# Patient Record
Sex: Male | Born: 1975 | Race: White | Hispanic: No | Marital: Single | Smoking: Current every day smoker
Health system: Southern US, Community
[De-identification: ages and names within clinical notes are randomized; demographics above are authoritative.]

---

## 2000-04-30 ENCOUNTER — Emergency Department (HOSPITAL_COMMUNITY): Admission: EM | Admit: 2000-04-30 | Discharge: 2000-05-01 | Payer: Self-pay | Admitting: Emergency Medicine

## 2000-04-30 ENCOUNTER — Encounter: Payer: Self-pay | Admitting: Emergency Medicine

## 2003-11-13 ENCOUNTER — Encounter: Admission: RE | Admit: 2003-11-13 | Discharge: 2003-11-13 | Payer: Self-pay | Admitting: Infectious Diseases

## 2003-11-17 ENCOUNTER — Ambulatory Visit (HOSPITAL_COMMUNITY): Admission: RE | Admit: 2003-11-17 | Discharge: 2003-11-17 | Payer: Self-pay | Admitting: Infectious Diseases

## 2003-11-21 ENCOUNTER — Encounter: Admission: RE | Admit: 2003-11-21 | Discharge: 2003-11-21 | Payer: Self-pay | Admitting: Infectious Diseases

## 2003-11-21 ENCOUNTER — Ambulatory Visit (HOSPITAL_COMMUNITY): Admission: RE | Admit: 2003-11-21 | Discharge: 2003-11-21 | Payer: Self-pay | Admitting: Infectious Diseases

## 2003-12-12 ENCOUNTER — Encounter: Admission: RE | Admit: 2003-12-12 | Discharge: 2003-12-12 | Payer: Self-pay | Admitting: Infectious Diseases

## 2004-01-08 ENCOUNTER — Encounter: Admission: RE | Admit: 2004-01-08 | Discharge: 2004-01-08 | Payer: Self-pay | Admitting: Infectious Diseases

## 2005-02-10 ENCOUNTER — Ambulatory Visit: Payer: Self-pay | Admitting: Infectious Diseases

## 2005-02-11 ENCOUNTER — Ambulatory Visit (HOSPITAL_COMMUNITY): Admission: RE | Admit: 2005-02-11 | Discharge: 2005-02-11 | Payer: Self-pay | Admitting: Infectious Diseases

## 2005-02-17 ENCOUNTER — Ambulatory Visit: Payer: Self-pay | Admitting: Infectious Diseases

## 2005-04-16 ENCOUNTER — Emergency Department (HOSPITAL_COMMUNITY): Admission: EM | Admit: 2005-04-16 | Discharge: 2005-04-16 | Payer: Self-pay | Admitting: Emergency Medicine

## 2005-04-18 ENCOUNTER — Encounter
Admission: RE | Admit: 2005-04-18 | Discharge: 2005-07-17 | Payer: Self-pay | Admitting: Physical Medicine & Rehabilitation

## 2005-04-18 ENCOUNTER — Ambulatory Visit: Payer: Self-pay | Admitting: Anesthesiology

## 2005-12-21 ENCOUNTER — Emergency Department (HOSPITAL_COMMUNITY): Admission: EM | Admit: 2005-12-21 | Discharge: 2005-12-21 | Payer: Self-pay | Admitting: Emergency Medicine

## 2007-03-02 ENCOUNTER — Emergency Department (HOSPITAL_COMMUNITY): Admission: EM | Admit: 2007-03-02 | Discharge: 2007-03-02 | Payer: Self-pay | Admitting: Emergency Medicine

## 2007-05-14 ENCOUNTER — Emergency Department (HOSPITAL_COMMUNITY): Admission: EM | Admit: 2007-05-14 | Discharge: 2007-05-14 | Payer: Self-pay | Admitting: Emergency Medicine

## 2011-02-09 ENCOUNTER — Emergency Department: Payer: Self-pay | Admitting: Unknown Physician Specialty
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
## 2011-04-09 ENCOUNTER — Emergency Department (HOSPITAL_COMMUNITY): Payer: Self-pay

## 2011-04-09 ENCOUNTER — Emergency Department (HOSPITAL_COMMUNITY)
Admission: EM | Admit: 2011-04-09 | Discharge: 2011-04-09 | Disposition: A | Discharge status: Home / Self Care | Payer: Self-pay | Attending: Emergency Medicine | Admitting: Emergency Medicine

## 2011-04-09 DIAGNOSIS — S0083XA Contusion of other part of head, initial encounter: Secondary | ICD-10-CM | POA: Insufficient documentation

## 2011-04-09 DIAGNOSIS — IMO0002 Reserved for concepts with insufficient information to code with codable children: Secondary | ICD-10-CM | POA: Insufficient documentation

## 2011-04-09 DIAGNOSIS — Y92410 Unspecified street and highway as the place of occurrence of the external cause: Secondary | ICD-10-CM | POA: Insufficient documentation

## 2011-04-09 DIAGNOSIS — S0003XA Contusion of scalp, initial encounter: Secondary | ICD-10-CM | POA: Insufficient documentation

## 2011-04-09 DIAGNOSIS — Y998 Other external cause status: Secondary | ICD-10-CM | POA: Insufficient documentation

## 2013-11-09 ENCOUNTER — Emergency Department (HOSPITAL_COMMUNITY): Payer: Medicaid Other

## 2013-11-09 ENCOUNTER — Emergency Department (HOSPITAL_COMMUNITY)
Admission: EM | Admit: 2013-11-09 | Discharge: 2013-11-09 | Disposition: A | Discharge status: Home / Self Care | Payer: Medicaid Other | Attending: Emergency Medicine | Admitting: Emergency Medicine

## 2013-11-09 ENCOUNTER — Encounter (HOSPITAL_COMMUNITY): Payer: Self-pay | Admitting: Emergency Medicine

## 2013-11-09 DIAGNOSIS — R1110 Vomiting, unspecified: Secondary | ICD-10-CM

## 2013-11-09 DIAGNOSIS — R197 Diarrhea, unspecified: Secondary | ICD-10-CM | POA: Insufficient documentation

## 2013-11-09 DIAGNOSIS — R109 Unspecified abdominal pain: Secondary | ICD-10-CM | POA: Insufficient documentation

## 2013-11-09 DIAGNOSIS — R112 Nausea with vomiting, unspecified: Secondary | ICD-10-CM | POA: Insufficient documentation

## 2013-11-09 DIAGNOSIS — F17200 Nicotine dependence, unspecified, uncomplicated: Secondary | ICD-10-CM | POA: Insufficient documentation

## 2013-11-09 DIAGNOSIS — R630 Anorexia: Secondary | ICD-10-CM | POA: Insufficient documentation

## 2013-11-09 DIAGNOSIS — R1030 Lower abdominal pain, unspecified: Secondary | ICD-10-CM

## 2013-11-09 LAB — CBC WITH DIFFERENTIAL/PLATELET
BASOS PCT: 0 % (ref 0–1)
Basophils Absolute: 0 10*3/uL (ref 0.0–0.1)
EOS PCT: 0 % (ref 0–5)
Eosinophils Absolute: 0 10*3/uL (ref 0.0–0.7)
HCT: 41 % (ref 39.0–52.0)
HEMOGLOBIN: 14.3 g/dL (ref 13.0–17.0)
LYMPHS ABS: 1.6 10*3/uL (ref 0.7–4.0)
LYMPHS PCT: 16 % (ref 12–46)
MCH: 30 pg (ref 26.0–34.0)
MCHC: 34.9 g/dL (ref 30.0–36.0)
MCV: 86.1 fL (ref 78.0–100.0)
MONO ABS: 0.9 10*3/uL (ref 0.1–1.0)
MONOS PCT: 9 % (ref 3–12)
Neutro Abs: 7.5 10*3/uL (ref 1.7–7.7)
Neutrophils Relative %: 74 % (ref 43–77)
Platelets: 274 10*3/uL (ref 150–400)
RBC: 4.76 MIL/uL (ref 4.22–5.81)
RDW: 12.8 % (ref 11.5–15.5)
WBC: 10.1 10*3/uL (ref 4.0–10.5)

## 2013-11-09 LAB — COMPREHENSIVE METABOLIC PANEL
ALBUMIN: 4.1 g/dL (ref 3.5–5.2)
ALT: 25 U/L (ref 0–53)
AST: 18 U/L (ref 0–37)
Alkaline Phosphatase: 84 U/L (ref 39–117)
BILIRUBIN TOTAL: 0.6 mg/dL (ref 0.3–1.2)
BUN: 14 mg/dL (ref 6–23)
CALCIUM: 9 mg/dL (ref 8.4–10.5)
CHLORIDE: 102 meq/L (ref 96–112)
CO2: 26 mEq/L (ref 19–32)
Creatinine, Ser: 0.95 mg/dL (ref 0.50–1.35)
Glucose, Bld: 101 mg/dL — ABNORMAL HIGH (ref 70–99)
Potassium: 3.9 mEq/L (ref 3.7–5.3)
SODIUM: 139 meq/L (ref 137–147)
TOTAL PROTEIN: 6.7 g/dL (ref 6.0–8.3)

## 2013-11-09 LAB — LIPASE, BLOOD: LIPASE: 25 U/L (ref 11–59)

## 2013-11-09 MED ORDER — IOHEXOL 300 MG/ML  SOLN
100.0000 mL | Freq: Once | INTRAMUSCULAR | Status: AC | PRN
  Administered 2013-11-09: 100 mL via INTRAVENOUS

## 2013-11-09 MED ORDER — SODIUM CHLORIDE 0.9 % IV BOLUS (SEPSIS)
1000.0000 mL | Freq: Once | INTRAVENOUS | Status: AC
  Administered 2013-11-09: 1000 mL via INTRAVENOUS

## 2013-11-09 MED ORDER — ONDANSETRON HCL 4 MG PO TABS: 4.0000 mg | ORAL_TABLET | Freq: Four times a day (QID) | ORAL | Status: AC

## 2013-11-09 MED ORDER — GI COCKTAIL ~~LOC~~
30.0000 mL | Freq: Once | ORAL | Status: AC
  Administered 2013-11-09: 30 mL via ORAL
  Filled 2013-11-09: qty 30

## 2013-11-09 MED ORDER — ONDANSETRON HCL 4 MG/2ML IJ SOLN
4.0000 mg | Freq: Once | INTRAMUSCULAR | Status: AC
  Administered 2013-11-09: 4 mg via INTRAVENOUS
  Filled 2013-11-09: qty 2

## 2013-11-09 MED ORDER — HYDROCODONE-ACETAMINOPHEN 5-325 MG PO TABS: 1.0000 | ORAL_TABLET | Freq: Four times a day (QID) | ORAL | Status: AC | PRN

## 2013-11-09 MED ORDER — IOHEXOL 300 MG/ML  SOLN
50.0000 mL | Freq: Once | INTRAMUSCULAR | Status: AC | PRN
  Administered 2013-11-09: 50 mL via ORAL

## 2013-11-09 NOTE — ED Notes (Signed)
Pt transported to CT ?

## 2013-11-09 NOTE — ED Notes (Signed)
Bed: WA04 Expected date:  Expected time:  Means of arrival:  Comments: EMS- abdominal pain/n/v

## 2013-11-09 NOTE — Discharge Instructions (Signed)
Abdominal Pain, Adult °Many things can cause abdominal pain. Usually, abdominal pain is not caused by a disease and will improve without treatment. It can often be observed and treated at home. Your health care provider will do a physical exam and possibly order blood tests and X-rays to help determine the seriousness of your pain. However, in many cases, more time must pass before a clear cause of the pain can be found. Before that point, your health care provider may not know if you need more testing or further treatment. °HOME CARE INSTRUCTIONS  °Monitor your abdominal pain for any changes. The following actions may help to alleviate any discomfort you are experiencing: °· Only take over-the-counter or prescription medicines as directed by your health care provider. °· Do not take laxatives unless directed to do so by your health care provider. °· Try a clear liquid diet (broth, tea, or water) as directed by your health care provider. Slowly move to a bland diet as tolerated. °SEEK MEDICAL CARE IF: °· You have unexplained abdominal pain. °· You have abdominal pain associated with nausea or diarrhea. °· You have pain when you urinate or have a bowel movement. °· You experience abdominal pain that wakes you in the night. °· You have abdominal pain that is worsened or improved by eating food. °· You have abdominal pain that is worsened with eating fatty foods. °SEEK IMMEDIATE MEDICAL CARE IF:  °· Your pain does not go away within 2 hours. °· You have a fever. °· You keep throwing up (vomiting). °· Your pain is felt only in portions of the abdomen, such as the right side or the left lower portion of the abdomen. °· You pass bloody or black tarry stools. °MAKE SURE YOU: °· Understand these instructions.   °· Will watch your condition.   °· Will get help right away if you are not doing well or get worse.   °Document Released: 07/27/2005 Document Revised: 08/07/2013 Document Reviewed: 06/26/2013 °[employer]® Patient  Information ©2014 [employer]. ° °

## 2013-11-09 NOTE — ED Notes (Signed)
Pt noted to have vomited once in the floor. All clear liquid. Pt's gown changed. Pt sts he "has never vomited just all clear fluid before."

## 2013-11-09 NOTE — ED Notes (Signed)
Pt given an urinal

## 2013-11-09 NOTE — ED Notes (Signed)
Pt, from home, c/o intermittent abdominal pain, emesis, and diarrhea x 6 months.  Pain score 8/10.  Tender on palpation.  Vitals are stable.

## 2013-11-09 NOTE — ED Provider Notes (Signed)
CSN: [id number]     Arrival date & time 11/09/13  4098 History   First MD Initiated Contact with Patient 11/09/13 [phone]     Chief Complaint  Patient presents with  . Abdominal Pain  . Emesis  . Diarrhea   (Consider location/radiation/quality/duration/timing/severity/associated sxs/prior Treatment) Patient is a 38 y.o. male presenting with abdominal pain, vomiting, and diarrhea. The history is provided by the patient and a relative.  Abdominal Pain Pain location:  Periumbilical, LLQ and RLQ Pain quality: aching   Pain radiates to:  Does not radiate Pain severity:  Severe Onset quality:  Sudden Duration: 6 months. Timing:  Intermittent Progression:  Waxing and waning Chronicity:  Chronic Context: retching   Context: not diet changes, not eating, not medication withdrawal, not previous surgeries, not recent illness and not recent travel   Relieved by:  Nothing Worsened by:  Nothing tried Ineffective treatments:  None tried Associated symptoms: anorexia, constipation, diarrhea (had d/a for first 4 months, none since), nausea and vomiting   Associated symptoms: no chest pain, no cough, no dysuria, no fatigue, no fever and no shortness of breath   Emesis Associated symptoms: abdominal pain and diarrhea (had d/a for first 4 months, none since)   Associated symptoms: no headaches   Diarrhea Associated symptoms: abdominal pain and vomiting   Associated symptoms: no fever and no headaches     History reviewed. No pertinent past medical history. History reviewed. No pertinent past surgical history. No family history on file. History  Substance Use Topics  . Smoking status: Current Every Day Smoker  . Smokeless tobacco: Not on file  . Alcohol Use: Yes     Comment: seldom    Review of Systems  Constitutional: Negative for fever, activity change, appetite change and fatigue.  HENT: Negative for congestion, facial swelling, rhinorrhea and trouble swallowing.   Eyes: Negative for  photophobia and pain.  Respiratory: Negative for cough, chest tightness and shortness of breath.   Cardiovascular: Negative for chest pain and leg swelling.  Gastrointestinal: Positive for nausea, vomiting, abdominal pain, diarrhea (had d/a for first 4 months, none since), constipation and anorexia.  Endocrine: Negative for polydipsia and polyuria.  Genitourinary: Negative for dysuria, urgency, decreased urine volume and difficulty urinating.  Musculoskeletal: Negative for back pain and gait problem.  Skin: Negative for color change, rash and wound.  Allergic/Immunologic: Negative for immunocompromised state.  Neurological: Negative for dizziness, facial asymmetry, speech difficulty, weakness, numbness and headaches.  Psychiatric/Behavioral: Negative for confusion, decreased concentration and agitation.    Allergies  Review of patient's allergies indicates no known allergies.  Home Medications   Current Outpatient Rx  Name  Route  Sig  Dispense  Refill  . Milk Thistle 1000 MG CAPS   Oral   Take 1 capsule by mouth daily.         Marland Kitchen oxyCODONE-acetaminophen (PERCOCET) 10-325 MG per tablet   Oral   Take 1 tablet by mouth 2 (two) times daily.         Marland Kitchen HYDROcodone-acetaminophen (NORCO) 5-325 MG per tablet   Oral   Take 1 tablet by mouth every 6 (six) hours as needed.   10 tablet   0   . ondansetron (ZOFRAN) 4 MG tablet   Oral   Take 1 tablet (4 mg total) by mouth every 6 (six) hours.   12 tablet   0    BP 118/69  Pulse 88  Temp(Src) 97.7 F (36.5 C) (Oral)  Resp 16  SpO2 97% Physical  Exam  Constitutional: He is oriented to person, place, and time. He appears well-developed and well-nourished. No distress.  HENT:  Head: Normocephalic and atraumatic.  Mouth/Throat: No oropharyngeal exudate.  Eyes: Pupils are equal, round, and reactive to light.  Neck: Normal range of motion. Neck supple.  Cardiovascular: Normal rate, regular rhythm and normal heart sounds.  Exam  reveals no gallop and no friction rub.   No murmur heard. Pulmonary/Chest: Effort normal and breath sounds normal. No respiratory distress. He has no wheezes. He has no rales.  Abdominal: Soft. Bowel sounds are normal. He exhibits no distension and no mass. There is tenderness (generalized without rebound or guarding). There is no rebound and no guarding.  Musculoskeletal: Normal range of motion. He exhibits no edema and no tenderness.  Neurological: He is alert and oriented to person, place, and time.  Skin: Skin is warm and dry.  Psychiatric: He has a normal mood and affect.    ED Course  Procedures (including critical care time) Labs Review Labs Reviewed  COMPREHENSIVE METABOLIC PANEL - Abnormal; Notable for the following:    Glucose, Bld 101 (*)    All other components within normal limits  CLOSTRIDIUM DIFFICILE BY PCR  CBC WITH DIFFERENTIAL  LIPASE, BLOOD   Imaging Review Ct Abdomen Pelvis W Contrast  11/09/2013   CLINICAL DATA:  Pain and diarrhea.  EXAM: CT ABDOMEN AND PELVIS WITH CONTRAST  TECHNIQUE: Multidetector CT imaging of the abdomen and pelvis was performed using the standard protocol following bolus administration of intravenous contrast.  CONTRAST:  OMNIPAQUE IOHEXOL 300 MG/ML  SOLN  COMPARISON:  None.  FINDINGS: The lung bases are clear except for dependent bibasilar atelectasis/ edema. The heart is normal in size. No pericardial effusion.  The liver is unremarkable. The gallbladder is normal. No common bowel duct dilatation. The pancreas is normal. The spleen is normal. The adrenal glands and kidneys are normal.  The stomach is unremarkable. The duodenum and small bowel appear normal. No inflammatory changes, mass lesions or obstructive findings. There is mild fibro fatty infiltrated type changes involving the distal ileal loops of small bowel and terminal ileum. This can be seen with chronic inflammatory bowel disease. No active/acute inflammation. The appendix is  normal.  No mesenteric or retroperitoneal mass or adenopathy. The aorta and branch vessels are normal. The portal, hepatic, splenic and renal veins are patent.  The bladder, prostate gland and seminal vesicles are unremarkable except for prostatic utricular cyst which measures 2.3 x 2.0 cm. No pelvic mass, adenopathy or free pelvic fluid collections. No inguinal mass or adenopathy. There is a small right inguinal hernia containing fat.  The bony structures are unremarkable.  IMPRESSION: 1. Fibrofatty submucosal infiltrative type changes involving the distal and terminal ileum. This is most likely due to chronic inflammatory bowel disease. No active/acute inflammation is identified. 2. 2 centimeter prostatic utricular cyst. 3. No acute abdominal/pelvic findings, mass lesions or adenopathy.   Electronically Signed   By: Loralie Champagne M.D.   On: 11/09/2013 13:05    EKG Interpretation   None       MDM   1. Lower abdominal pain   2. Vomiting and diarrhea    Pt is a 38 y.o. male with Pmhx as above who presents with 6 months of periumbilical and low abdominal pain about 5 days a week lasting mins to hours w/o aggrevating or alleviating symptoms, with assoc occasional n/v, with worsening n/v, lightheadedness & near syncopal episodes last PM. Pt states be  has no pain currently, only soreness, but still feels weak.  He was evaluated 6 months ago for this problem, but has not seen a doctor since then.  On PE, VSS, pt in NAD.  +generalized abdominal tenderness. W/u shows unremarkable CBC, CMP, lipase.  CT ab/pelvis show evidence of likely IBD, but no acute inflammation.  Will refer to GI as outpt.  Will not start steroids as there does not appear to be acute flare.  Return precautions given for new or worsening symptoms including worsening pain, fever, inability to tolerate PO.          Shanna CiscoMegan E Jilene Spohr, MD 11/09/13 [phone]

## 2013-11-22 ENCOUNTER — Other Ambulatory Visit: Payer: Self-pay | Admitting: Gastroenterology

## 2013-11-22 DIAGNOSIS — R109 Unspecified abdominal pain: Secondary | ICD-10-CM

## 2013-11-26 ENCOUNTER — Ambulatory Visit
Admission: RE | Admit: 2013-11-26 | Discharge: 2013-11-26 | Disposition: A | Discharge status: Home / Self Care | Payer: No Typology Code available for payment source | Source: Ambulatory Visit | Attending: Gastroenterology | Admitting: Gastroenterology

## 2013-11-26 DIAGNOSIS — R109 Unspecified abdominal pain: Secondary | ICD-10-CM

## 2013-11-27 ENCOUNTER — Other Ambulatory Visit (HOSPITAL_COMMUNITY): Payer: Self-pay | Admitting: Gastroenterology

## 2013-11-27 DIAGNOSIS — R9389 Abnormal findings on diagnostic imaging of other specified body structures: Secondary | ICD-10-CM

## 2013-11-27 DIAGNOSIS — R109 Unspecified abdominal pain: Secondary | ICD-10-CM

## 2013-11-29 ENCOUNTER — Ambulatory Visit (HOSPITAL_COMMUNITY)
Admission: RE | Admit: 2013-11-29 | Discharge: 2013-11-29 | Disposition: A | Discharge status: Home / Self Care | Payer: Medicaid Other | Source: Ambulatory Visit | Attending: Gastroenterology | Admitting: Gastroenterology

## 2013-11-29 DIAGNOSIS — R1013 Epigastric pain: Secondary | ICD-10-CM | POA: Insufficient documentation

## 2013-11-29 DIAGNOSIS — R9389 Abnormal findings on diagnostic imaging of other specified body structures: Secondary | ICD-10-CM

## 2013-11-29 DIAGNOSIS — R109 Unspecified abdominal pain: Secondary | ICD-10-CM

## 2014-07-08 IMAGING — CR DG BE W/ AIR HIGH DENSITY
8 of 10 series · 8 of 10 positions shown · non-contrast
Comparison: DG UGI W/SMALL BOWEL HIGH DENSITY dated 11/26/2013; CT
ABD/PELVIS W CM dated 11/09/2013

FLUOROSCOPY TIME:  6 min, 13 seconds

CLINICAL DATA: Epigastric pain. Fat deposition in the distal ileum
wall, which can be incidental but can be observed in the setting of
inflammatory bowel disease.

EXAM:
AIR CONTRAST BARIUM ENEMA
TECHNIQUE: Initial scout AP supine abdominal image obtained to insure adequate
colon cleansing. Barium was introduced into the colon in a
retrograde fashion and refluxed from the rectum to the distal
transverse colon. As much of the barium as possible was then removed
through the indwelling tube via gravity drain. Air was then
insufflated into the colon. Spot images of the colon followed by
overhead radiographs were obtained.

[view not recorded (1 of 8)]
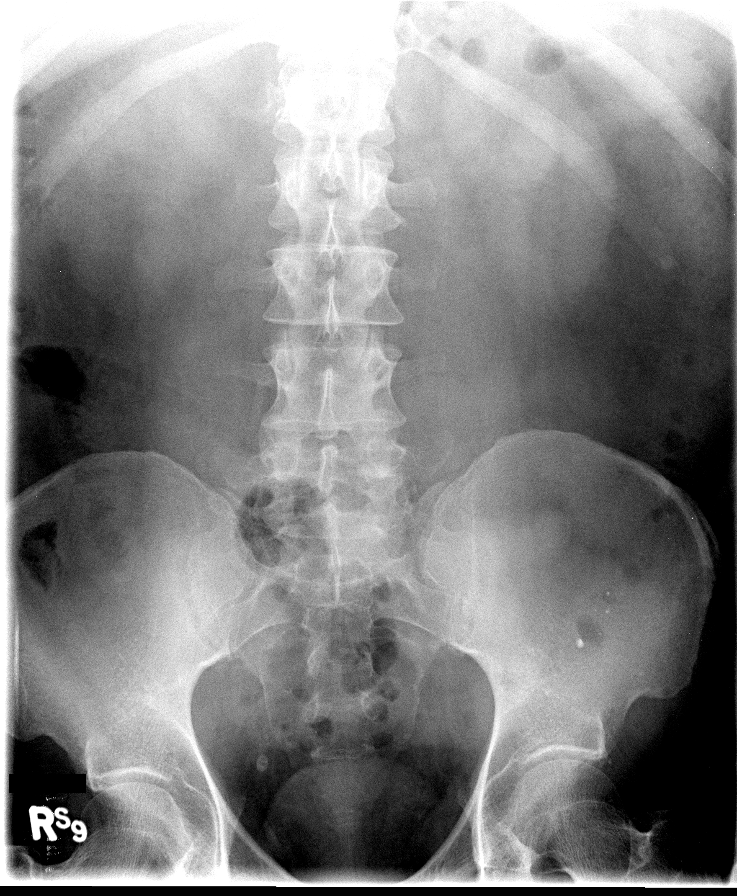

[view not recorded (2 of 8)]
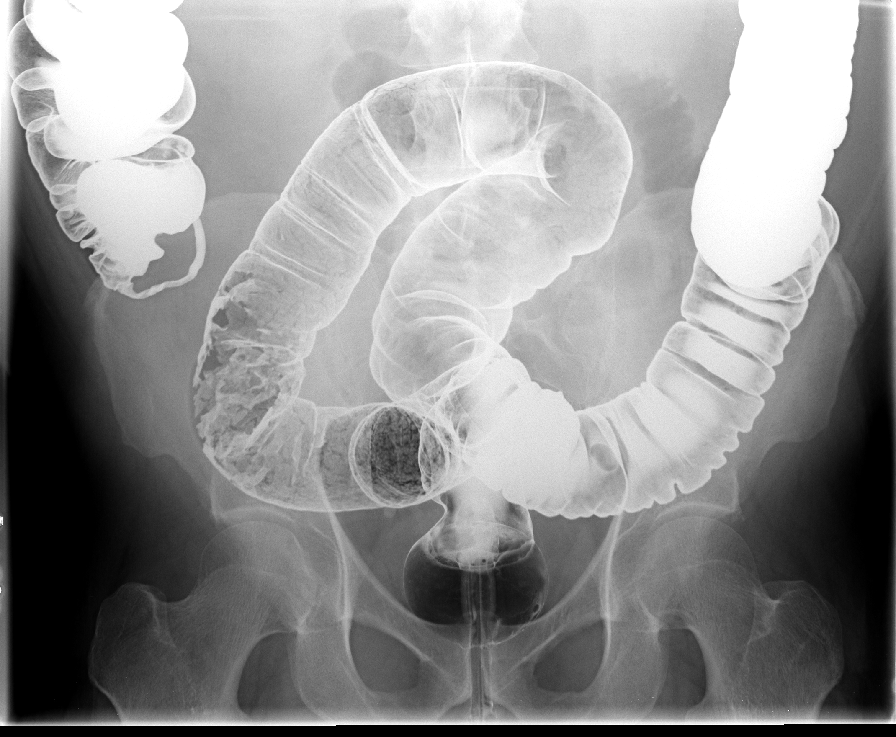

[view not recorded (3 of 8)]
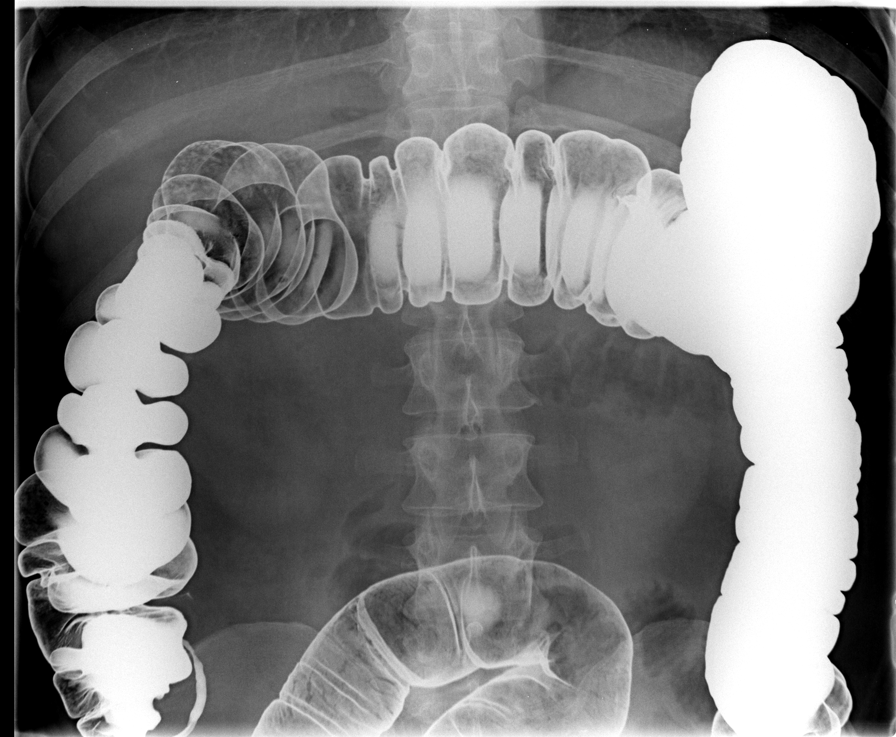

[view not recorded (4 of 8)]
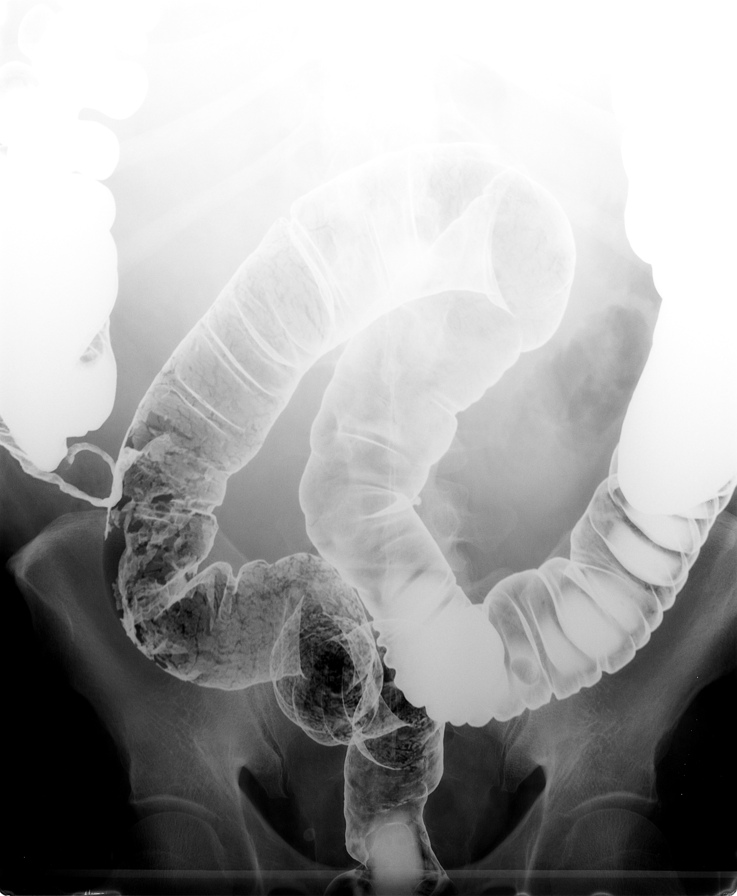

[view not recorded (5 of 8)]
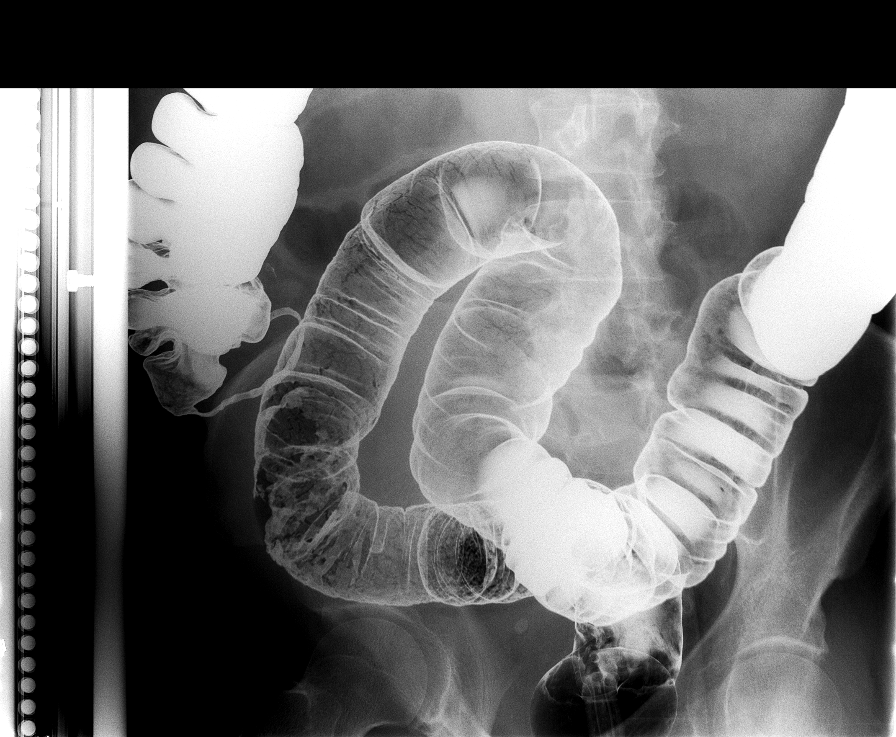

[view not recorded (6 of 8)]
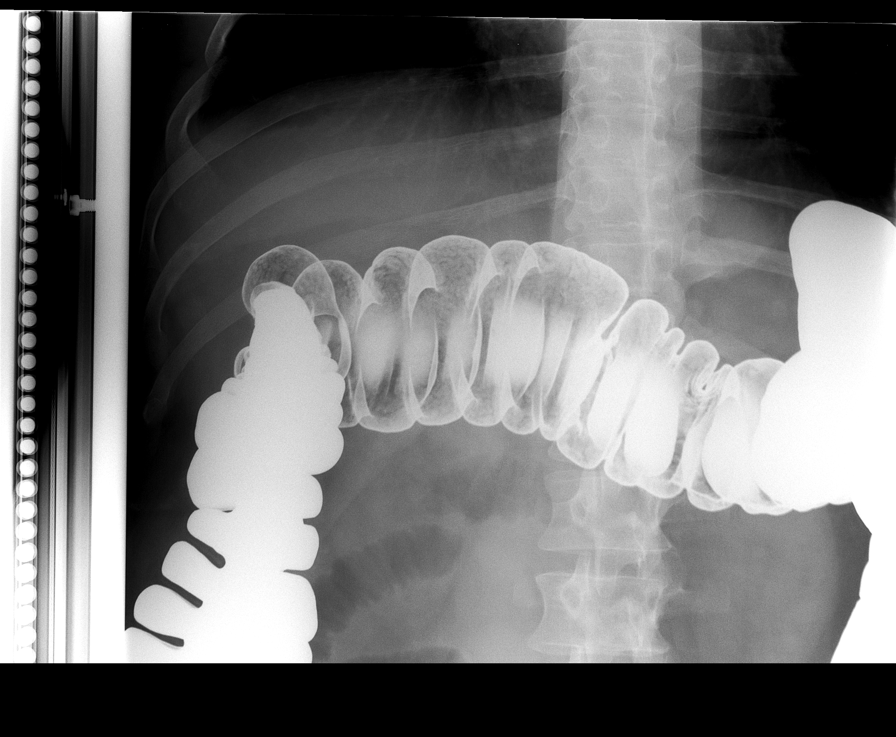

[view not recorded (7 of 8)]
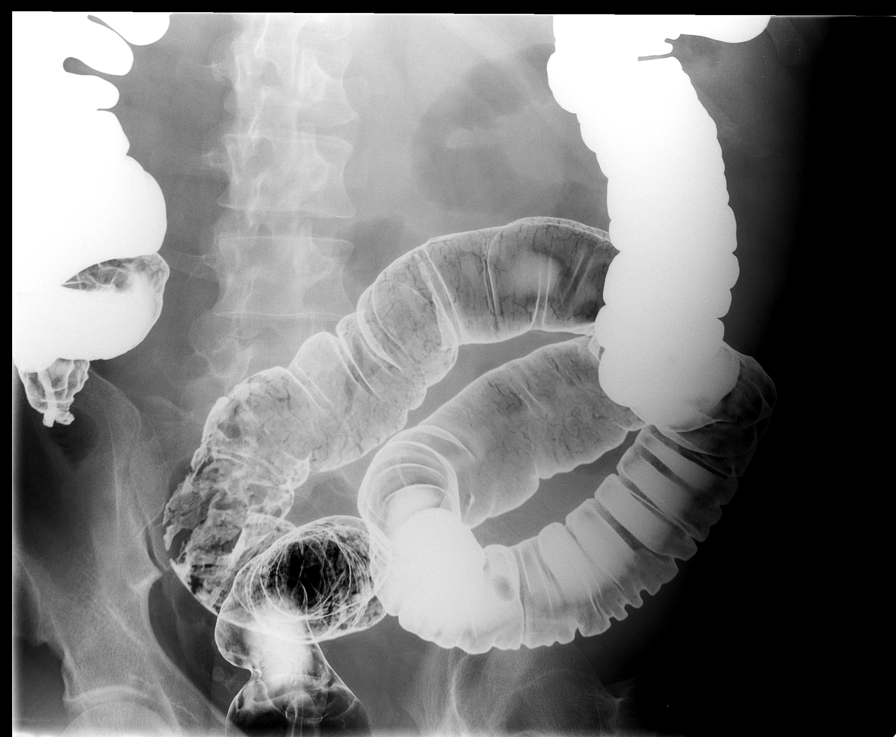

[view not recorded (8 of 8)]
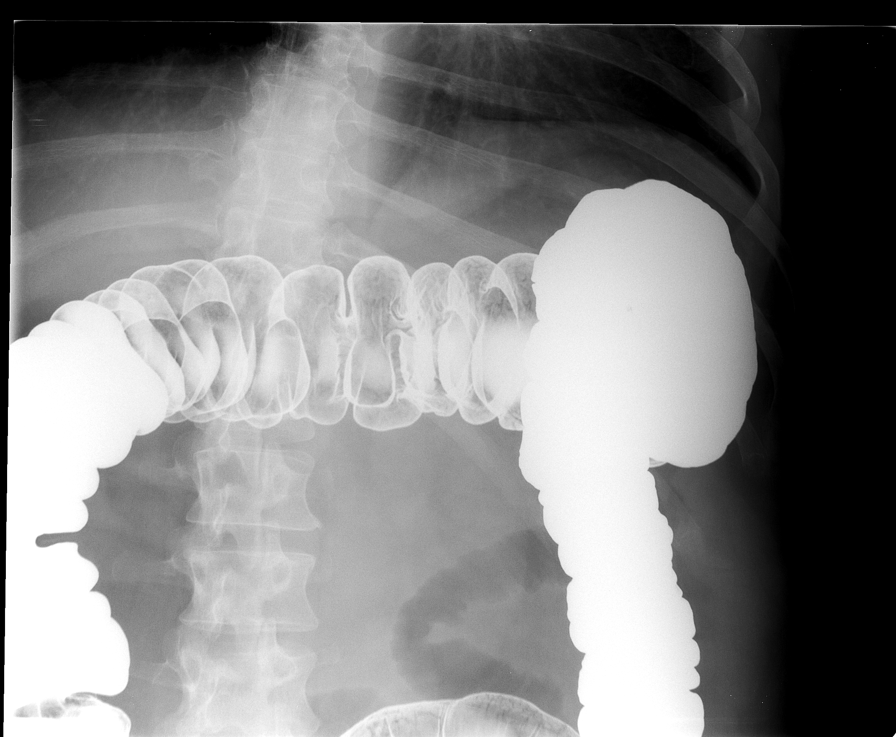

[8 of 10 positions shown; findings below may reference images not displayed]

FINDINGS: Initial KUB demonstrates a tiny amount of residual contrast medium
in the descending colon, but not enough to interfere with the exam.

A somewhat tortuous course of the sigmoid colon made it difficult a
drain the barium from the colon for air contrast. I performed a
fairly exhaustive set of maneuvers including upright, Trendelenburg,
right and left decubitus, and other gravity maneuvers to drain as
much contrast as I could from the colon, but despite this there is
still considerable residual barium pooling in parts of the colon
which can adversely affect diagnostic sensitivity.

12 mm persistent filling defect in the sigmoid colon as on series 19
is suspicious for a small polyp.

There is a 10 mm slightly more irregular filling defect in the
proximal descending colon as on series 26, potentially from adherent
stool or small polyp.

We were able to reflux the appendix. The terminal ileum did not
reflux. No other significant abnormalities observed.
IMPRESSION: 1. There is suspicion for a 12 mm sigmoid colon polyp and a 10 mm
proximal descending colon polyp. Otherwise normal exam.

## 2019-06-01 DEATH — deceased
